# Patient Record
Sex: Female | Born: 1980 | Race: White | Hispanic: No | Marital: Married | Smoking: Never smoker
Health system: Southern US, Community
[De-identification: ages and names within clinical notes are randomized; demographics above are authoritative.]

## PROBLEM LIST (undated history)

## (undated) DIAGNOSIS — R519 Headache, unspecified: Secondary | ICD-10-CM

## (undated) DIAGNOSIS — T7840XA Allergy, unspecified, initial encounter: Secondary | ICD-10-CM

## (undated) DIAGNOSIS — R51 Headache: Secondary | ICD-10-CM

## (undated) HISTORY — DX: Allergy, unspecified, initial encounter: T78.40XA

## (undated) HISTORY — DX: Headache, unspecified: R51.9

## (undated) HISTORY — DX: Headache: R51

## (undated) HISTORY — PX: TONSILLECTOMY: SHX5217

---

## 2010-11-14 NOTE — L&D Delivery Note (Signed)
Delivery Note At 8:44 AM a viable and healthy female was delivered via  (Presentation: ;  ).  APGAR:8 9, ; weight pending .   Placenta status: spontaneous and intact with 3 vessel  Cord:  with the following complications: none .  Cord pH: na  Anesthesia:  epidural Episiotomy: none Lacerations: 1st degree Suture Repair: 3.0 vicryl rapide Est. Blood Loss (mL): 500  Mom to postpartum.  Baby to nursery-stable.  Neima Lacross J 10/16/2011, 8:59 AM

## 2011-04-27 LAB — RPR: RPR: NONREACTIVE

## 2011-04-27 LAB — ANTIBODY SCREEN: Antibody Screen: NEGATIVE

## 2011-04-27 LAB — HEPATITIS B SURFACE ANTIGEN: Hepatitis B Surface Ag: NEGATIVE

## 2011-04-27 LAB — RUBELLA ANTIBODY, IGM: Rubella: IMMUNE

## 2011-06-15 ENCOUNTER — Inpatient Hospital Stay (HOSPITAL_COMMUNITY): Admission: AD | Admit: 2011-06-15 | Payer: Self-pay | Source: Ambulatory Visit | Admitting: Obstetrics and Gynecology

## 2011-10-16 ENCOUNTER — Encounter (HOSPITAL_COMMUNITY): Payer: Self-pay | Admitting: Anesthesiology

## 2011-10-16 ENCOUNTER — Encounter (HOSPITAL_COMMUNITY): Payer: Self-pay | Admitting: *Deleted

## 2011-10-16 ENCOUNTER — Inpatient Hospital Stay (HOSPITAL_COMMUNITY)
Admission: AD | Admit: 2011-10-16 | Discharge: 2011-10-17 | DRG: 775 | Disposition: A | Discharge status: Home / Self Care | Payer: No Typology Code available for payment source | Source: Ambulatory Visit | Attending: Obstetrics and Gynecology | Admitting: Obstetrics and Gynecology

## 2011-10-16 ENCOUNTER — Inpatient Hospital Stay (HOSPITAL_COMMUNITY): Payer: No Typology Code available for payment source | Admitting: Anesthesiology

## 2011-10-16 DIAGNOSIS — O99892 Other specified diseases and conditions complicating childbirth: Secondary | ICD-10-CM | POA: Diagnosis present

## 2011-10-16 DIAGNOSIS — Z22330 Carrier of Group B streptococcus: Secondary | ICD-10-CM

## 2011-10-16 LAB — CBC
Hemoglobin: 11.7 g/dL — ABNORMAL LOW (ref 12.0–15.0)
MCH: 29.9 pg (ref 26.0–34.0)
MCHC: 34.1 g/dL (ref 30.0–36.0)
RDW: 13 % (ref 11.5–15.5)

## 2011-10-16 LAB — RPR: RPR Ser Ql: NONREACTIVE

## 2011-10-16 MED ORDER — OXYTOCIN 10 UNIT/ML IJ SOLN
INTRAMUSCULAR | Status: AC
  Administered 2011-10-16: 20 [IU]
  Filled 2011-10-16: qty 2

## 2011-10-16 MED ORDER — BENZOCAINE-MENTHOL 20-0.5 % EX AERO
1.0000 "application " | INHALATION_SPRAY | CUTANEOUS | Status: DC | PRN
  Administered 2011-10-16: 1 via TOPICAL

## 2011-10-16 MED ORDER — FENTANYL 2.5 MCG/ML BUPIVACAINE 1/10 % EPIDURAL INFUSION (WH - ANES)
14.0000 mL/h | INTRAMUSCULAR | Status: DC
  Filled 2011-10-16: qty 60

## 2011-10-16 MED ORDER — METHYLERGONOVINE MALEATE 0.2 MG/ML IJ SOLN: 0.2000 mg | INTRAMUSCULAR | Status: DC | PRN

## 2011-10-16 MED ORDER — ACETAMINOPHEN 325 MG PO TABS: 650.0000 mg | ORAL_TABLET | ORAL | Status: DC | PRN

## 2011-10-16 MED ORDER — LACTATED RINGERS IV SOLN: 500.0000 mL | Freq: Once | INTRAVENOUS | Status: DC

## 2011-10-16 MED ORDER — DIPHENHYDRAMINE HCL 50 MG/ML IJ SOLN: 12.5000 mg | INTRAMUSCULAR | Status: DC | PRN

## 2011-10-16 MED ORDER — PHENYLEPHRINE 40 MCG/ML (10ML) SYRINGE FOR IV PUSH (FOR BLOOD PRESSURE SUPPORT): 80.0000 ug | PREFILLED_SYRINGE | INTRAVENOUS | Status: DC | PRN

## 2011-10-16 MED ORDER — PHENYLEPHRINE 40 MCG/ML (10ML) SYRINGE FOR IV PUSH (FOR BLOOD PRESSURE SUPPORT)
80.0000 ug | PREFILLED_SYRINGE | INTRAVENOUS | Status: DC | PRN
  Filled 2011-10-16: qty 5

## 2011-10-16 MED ORDER — WITCH HAZEL-GLYCERIN EX PADS: 1.0000 "application " | MEDICATED_PAD | CUTANEOUS | Status: DC | PRN

## 2011-10-16 MED ORDER — BENZOCAINE-MENTHOL 20-0.5 % EX AERO
INHALATION_SPRAY | CUTANEOUS | Status: AC
  Administered 2011-10-16: 1 via TOPICAL
  Filled 2011-10-16: qty 56

## 2011-10-16 MED ORDER — LANOLIN HYDROUS EX OINT: TOPICAL_OINTMENT | CUTANEOUS | Status: DC | PRN

## 2011-10-16 MED ORDER — LACTATED RINGERS IV SOLN: 500.0000 mL | INTRAVENOUS | Status: DC | PRN

## 2011-10-16 MED ORDER — OXYTOCIN 20 UNITS IN LACTATED RINGERS INFUSION - SIMPLE
125.0000 mL/h | Freq: Once | INTRAVENOUS | Status: AC
  Administered 2011-10-16: 999 mL/h via INTRAVENOUS

## 2011-10-16 MED ORDER — PRENATAL PLUS 27-1 MG PO TABS
1.0000 | ORAL_TABLET | Freq: Every day | ORAL | Status: DC
  Administered 2011-10-17: 1 via ORAL
  Filled 2011-10-16: qty 1

## 2011-10-16 MED ORDER — LIDOCAINE HCL (PF) 1 % IJ SOLN
30.0000 mL | INTRAMUSCULAR | Status: DC | PRN
  Filled 2011-10-16: qty 30

## 2011-10-16 MED ORDER — SIMETHICONE 80 MG PO CHEW: 80.0000 mg | CHEWABLE_TABLET | ORAL | Status: DC | PRN

## 2011-10-16 MED ORDER — OXYTOCIN 20 UNITS IN LACTATED RINGERS INFUSION - SIMPLE: 125.0000 mL/h | INTRAVENOUS | Status: DC

## 2011-10-16 MED ORDER — METHYLERGONOVINE MALEATE 0.2 MG PO TABS: 0.2000 mg | ORAL_TABLET | ORAL | Status: DC | PRN

## 2011-10-16 MED ORDER — LIDOCAINE HCL 1.5 % IJ SOLN
INTRAMUSCULAR | Status: DC | PRN
  Administered 2011-10-16: 3 mL via EPIDURAL
  Administered 2011-10-16: 4 mL via EPIDURAL

## 2011-10-16 MED ORDER — DIBUCAINE 1 % RE OINT: 1.0000 "application " | TOPICAL_OINTMENT | RECTAL | Status: DC | PRN

## 2011-10-16 MED ORDER — IBUPROFEN 600 MG PO TABS
600.0000 mg | ORAL_TABLET | Freq: Four times a day (QID) | ORAL | Status: DC
  Administered 2011-10-16 – 2011-10-17 (×4): 600 mg via ORAL
  Filled 2011-10-16 (×4): qty 1

## 2011-10-16 MED ORDER — OXYCODONE-ACETAMINOPHEN 5-325 MG PO TABS: 1.0000 | ORAL_TABLET | ORAL | Status: DC | PRN

## 2011-10-16 MED ORDER — IBUPROFEN 600 MG PO TABS: 600.0000 mg | ORAL_TABLET | Freq: Four times a day (QID) | ORAL | Status: DC | PRN

## 2011-10-16 MED ORDER — DIPHENHYDRAMINE HCL 25 MG PO CAPS: 25.0000 mg | ORAL_CAPSULE | Freq: Four times a day (QID) | ORAL | Status: DC | PRN

## 2011-10-16 MED ORDER — ONDANSETRON HCL 4 MG/2ML IJ SOLN: 4.0000 mg | INTRAMUSCULAR | Status: DC | PRN

## 2011-10-16 MED ORDER — SENNOSIDES-DOCUSATE SODIUM 8.6-50 MG PO TABS
2.0000 | ORAL_TABLET | Freq: Every day | ORAL | Status: DC
  Administered 2011-10-16: 2 via ORAL

## 2011-10-16 MED ORDER — ONDANSETRON HCL 4 MG/2ML IJ SOLN: 4.0000 mg | Freq: Four times a day (QID) | INTRAMUSCULAR | Status: DC | PRN

## 2011-10-16 MED ORDER — EPHEDRINE 5 MG/ML INJ: 10.0000 mg | INTRAVENOUS | Status: DC | PRN

## 2011-10-16 MED ORDER — FLEET ENEMA 7-19 GM/118ML RE ENEM: 1.0000 | ENEMA | RECTAL | Status: DC | PRN

## 2011-10-16 MED ORDER — ONDANSETRON HCL 4 MG PO TABS: 4.0000 mg | ORAL_TABLET | ORAL | Status: DC | PRN

## 2011-10-16 MED ORDER — ZOLPIDEM TARTRATE 5 MG PO TABS: 5.0000 mg | ORAL_TABLET | Freq: Every evening | ORAL | Status: DC | PRN

## 2011-10-16 MED ORDER — OXYCODONE-ACETAMINOPHEN 5-325 MG PO TABS: 2.0000 | ORAL_TABLET | ORAL | Status: DC | PRN

## 2011-10-16 MED ORDER — LACTATED RINGERS IV SOLN
INTRAVENOUS | Status: DC
  Administered 2011-10-16 (×3): via INTRAVENOUS

## 2011-10-16 MED ORDER — EPHEDRINE 5 MG/ML INJ
10.0000 mg | INTRAVENOUS | Status: DC | PRN
  Filled 2011-10-16: qty 4

## 2011-10-16 MED ORDER — TETANUS-DIPHTH-ACELL PERTUSSIS 5-2.5-18.5 LF-MCG/0.5 IM SUSP
0.5000 mL | Freq: Once | INTRAMUSCULAR | Status: AC
  Administered 2011-10-17: 0.5 mL via INTRAMUSCULAR
  Filled 2011-10-16: qty 0.5

## 2011-10-16 MED ORDER — SODIUM CHLORIDE 0.9 % IV SOLN
2.0000 g | Freq: Once | INTRAVENOUS | Status: AC
  Administered 2011-10-16: 2 g via INTRAVENOUS
  Filled 2011-10-16: qty 2000

## 2011-10-16 MED ORDER — CITRIC ACID-SODIUM CITRATE 334-500 MG/5ML PO SOLN: 30.0000 mL | ORAL | Status: DC | PRN

## 2011-10-16 MED ORDER — FENTANYL 2.5 MCG/ML BUPIVACAINE 1/10 % EPIDURAL INFUSION (WH - ANES)
INTRAMUSCULAR | Status: DC | PRN
  Administered 2011-10-16: 12 mL/h via EPIDURAL

## 2011-10-16 MED ORDER — OXYTOCIN BOLUS FROM INFUSION
500.0000 mL | Freq: Once | INTRAVENOUS | Status: DC
  Filled 2011-10-16: qty 500
  Filled 2011-10-16: qty 1000

## 2011-10-16 NOTE — Progress Notes (Signed)
Bianca Butler is a 30 y.o. G2P1001 at [redacted]w[redacted]d by LMP admitted for active labor, rupture of membranes  Subjective: labor  Objective: BP 100/82  Pulse 52  Temp(Src) 98 F (36.7 C) (Oral)  Resp 20  Ht 5' 3.5" (1.613 m)  Wt 57.21 kg (126 lb 2 oz)  BMI 21.99 kg/m2  SpO2 100%      FHT:  FHR: 155 bpm, variability: moderate,  accelerations:  Present,  decelerations:  Absent UC:   regular, every 2 minutes SVE:   Dilation: 10 Effacement (%): 100 Station: +1 Exam by:: k fields, rn  Labs: Lab Results  Component Value Date   WBC 5.8 10/16/2011   HGB 11.7* 10/16/2011   HCT 34.3* 10/16/2011   MCV 87.7 10/16/2011   PLT 161 10/16/2011    Assessment / Plan: Augmentation of labor, progressing well  Labor: Progressing normally Preeclampsia:  na Fetal Wellbeing:  Category I Pain Control:  Epidural I/D:  n/a Anticipated MOD:  NSVD  Cassanda Walmer J 10/16/2011, 8:58 AM

## 2011-10-16 NOTE — Progress Notes (Signed)
Pt states, " My water broke at midnight. It was a lot and it has continued to run out. I am having a few contractions, but not many."

## 2011-10-16 NOTE — Anesthesia Procedure Notes (Signed)
Epidural Patient location during procedure: OB Start time: 10/16/2011 7:39 AM  Staffing Anesthesiologist: Heleena Miceli A. Performed by: anesthesiologist   Preanesthetic Checklist Completed: patient identified, site marked, surgical consent, pre-op evaluation, timeout performed, IV checked, risks and benefits discussed and monitors and equipment checked  Epidural Patient position: sitting Prep: site prepped and draped and DuraPrep Patient monitoring: continuous pulse ox and blood pressure Approach: midline Injection technique: LOR air  Needle:  Needle type: Tuohy  Needle gauge: 17 G Needle length: 9 cm Needle insertion depth: 5 cm cm Catheter type: closed end flexible Catheter size: 19 Gauge Catheter at skin depth: 8 cm Test dose: negative and 1.5% lidocaine  Assessment Events: blood not aspirated, injection not painful, no injection resistance, negative IV test and no paresthesia  Additional Notes Patient is more comfortable after epidural dosed. Please see RN's note for documentation of vital signs and FHR which are stable.

## 2011-10-16 NOTE — H&P (Signed)
NAMEJULI, ODOM              ACCOUNT NO.:  [id number]  MEDICAL RECORD NO.:  [id number]  LOCATION:  9163                          FACILITY:  [facility]  PHYSICIAN:  Lenoard Aden, M.D.DATE OF BIRTH:  06/20/1981  DATE OF ADMISSION:  10/16/2011 DATE OF DISCHARGE:                             HISTORY & PHYSICAL   CHIEF COMPLAINT:  Spontaneous rupture of membranes at midnight.  HISTORY OF PRESENT ILLNESS:  She is a 30 year old Caucasian female, G2, P1, at 38-3/7 weeks, spontaneous rupture of membranes clear, GBS positive who presents n early labor.  She has no known drug allergies. 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  She is a nonsmoker, nondrinker.  She denies domestic or physical violence.  MEDICATIONS:  Include prenatal vitamins.  She has a family history of heart disease, depression, nicotine dependence, diabetes.  She has a previous vaginal delivery at 41 weeks in 2010.  Pregnancy course complicated by group B strep positivity.  PHYSICAL EXAM:  GENERAL:  She is a well-developed, well-nourished, white female, in no acute distress. HEENT:  Normal. NECK:  Supple.  Full range of motion. LUNGS:  Clear. HEART:  Regular rhythm. ABDOMEN:  Soft, gravid, and nontender.  No CVA tenderness. EXTREMITIES:  There are no cords. NEUROLOGIC:  Nonfocal. SKIN:  Intact.  IMPRESSION:  Term intrauterine pregnancy in active labor, spontaneous rupture of membranes, group B streptococcus positive.  PLAN:  Ampicillin IV, epidural p.r.n.  Anticipate attempts at vaginal delivery.     Lenoard Aden, M.D.     RJT/MEDQ  D:  10/16/2011  T:  10/16/2011  Job:  [id number]

## 2011-10-16 NOTE — Anesthesia Preprocedure Evaluation (Addendum)
Anesthesia Evaluation  Patient identified by MRN, date of birth, ID band Patient awake    Reviewed: Allergy & Precautions, H&P , Patient's Chart, lab work & pertinent test results  Airway Mallampati: III TM Distance: >3 FB Neck ROM: full    Dental No notable dental hx. (+) Teeth Intact   Pulmonary neg pulmonary ROS,  clear to auscultation  Pulmonary exam normal       Cardiovascular neg cardio ROS regular Normal    Neuro/Psych Negative Neurological ROS  Negative Psych ROS   GI/Hepatic negative GI ROS, Neg liver ROS,   Endo/Other  Negative Endocrine ROS  Renal/GU negative Renal ROS  Genitourinary negative   Musculoskeletal   Abdominal   Peds  Hematology negative hematology ROS (+)   Anesthesia Other Findings   Reproductive/Obstetrics (+) Pregnancy                           Anesthesia Physical Anesthesia Plan  ASA: II  Anesthesia Plan: Epidural   Post-op Pain Management:    Induction:   Airway Management Planned:   Additional Equipment:   Intra-op Plan:   Post-operative Plan:   Informed Consent: I have reviewed the patients History and Physical, chart, labs and discussed the procedure including the risks, benefits and alternatives for the proposed anesthesia with the patient or authorized representative who has indicated his/her understanding and acceptance.     Plan Discussed with: Anesthesiologist, Surgeon and CRNA  Anesthesia Plan Comments:        Anesthesia Quick Evaluation

## 2011-10-16 NOTE — Anesthesia Postprocedure Evaluation (Signed)
  Anesthesia Post-op Note  Patient: Bianca Butler  Procedure(s) Performed: * No procedures listed *  Patient Location: PACU and Mother/Baby  Anesthesia Type: Epidural  Level of Consciousness: awake, alert  and oriented  Airway and Oxygen Therapy: Patient Spontanous Breathing   Post-op Assessment: Patient's Cardiovascular Status Stable and Respiratory Function Stable  Post-op Vital Signs: stable  Complications: No apparent anesthesia complications

## 2011-10-17 LAB — CBC
HCT: 33.5 % — ABNORMAL LOW (ref 36.0–46.0)
Hemoglobin: 11.3 g/dL — ABNORMAL LOW (ref 12.0–15.0)
MCV: 88.6 fL (ref 78.0–100.0)
RBC: 3.78 MIL/uL — ABNORMAL LOW (ref 3.87–5.11)
WBC: 7.4 10*3/uL (ref 4.0–10.5)

## 2011-10-17 MED ORDER — IBUPROFEN 600 MG PO TABS: 600.0000 mg | ORAL_TABLET | Freq: Four times a day (QID) | ORAL | Status: AC

## 2011-10-17 NOTE — Discharge Summary (Signed)
Obstetric Discharge Summary Reason for Admission: onset of labor Prenatal Procedures: none Intrapartum Procedures: spontaneous vaginal delivery Postpartum Procedures: none Complications-Operative and Postpartum: none Hemoglobin  Date Value Range Status  10/17/2011 11.3* 12.0-15.0 (g/dL) Final     HCT  Date Value Range Status  10/17/2011 33.5* 36.0-46.0 (%) Final    Discharge Diagnoses: Term Pregnancy-delivered  Discharge Information: Date: 10/17/2011 Activity: pelvic rest Diet: routine Medications: PNV and Ibuprofen Condition: stable Instructions: refer to practice specific booklet Discharge to: home Follow-up Information    Follow up with Lenoard Aden, MD. Make an appointment in 6 weeks.   Contact information:   [address] [place] [address] [phone]          Newborn Data: Live born female  Birth Weight: 7 lb 7.9 oz (3400 g) APGAR: 9, 9  Home with mother.  Bianca Butler 10/17/2011, 11:18 AM

## 2011-10-17 NOTE — Progress Notes (Signed)
Patient ID: Bianca Butler, female   DOB: 04-12-1981, 30 y.o.   MRN: [id number] PPD 1 SVD  S:  Reports feeling well, desires early dc home today             Tolerating po/ No nausea or vomiting             Bleeding is spotting             Pain controlled withibuprofen (OTC)             Up ad lib / ambulatory  Newborn breast feeding  / declines Circumcision   O:  A & O x 3, NAD, pleasant affect             VS: Blood pressure 98/66, pulse 56, temperature 97.8 F (36.6 C), temperature source Oral, resp. rate 20, height 5' 3.5" (1.613 m), weight 57.21 kg (126 lb 2 oz), SpO2 100.00%, unknown if currently breastfeeding.  LABS: Lab Results  Component Value Date   WBC 7.4 10/17/2011   HGB 11.3* 10/17/2011   HCT 33.5* 10/17/2011   MCV 88.6 10/17/2011   PLT 148* 10/17/2011     I&O: I/O last 3 completed shifts: In: -  Out: 500 [Blood:500]      Lungs: Clear and unlabored  Heart: regular rate and rhythm / no mumurs  Abdomen: soft, non-tender, non-distended              Fundus: firm, non-tender, U-1  Perineum: healing with no evidence of edema;ecchymosis  Lochia: scant rubra  Breast: Small blister to left nipple with evidence of compression following feed  Extremities: no edema, no calf pain or tenderness    A: PPD # 1   Doing well - stable status  Breastfeeding well- lactation consultation prior to d/c   P:         Routine post partum orders  Discuss positioning and latch techniques to facilitate deeper latch  Anticipate dc home this afternoon  Juanetta Beets [facility] 10/17/2011, 8:36 AM

## 2011-10-17 NOTE — Progress Notes (Signed)
Admission nutrition screen triggered. Patients chart reviewed and assessed  for nutritional risk. Patient is determined to be at low nutrition  risk. Pts weight is up 25 Lbs from pre-preg weight, adeq weight gain during pregnancy

## 2013-05-30 ENCOUNTER — Ambulatory Visit (INDEPENDENT_AMBULATORY_CARE_PROVIDER_SITE_OTHER): Payer: No Typology Code available for payment source | Admitting: Family Medicine

## 2013-05-30 ENCOUNTER — Encounter: Payer: Self-pay | Admitting: Family Medicine

## 2013-05-30 VITALS — BP 110/78 | Temp 98.0°F | Ht 63.5 in | Wt 106.0 lb

## 2013-05-30 DIAGNOSIS — J45909 Unspecified asthma, uncomplicated: Secondary | ICD-10-CM | POA: Insufficient documentation

## 2013-05-30 DIAGNOSIS — J4530 Mild persistent asthma, uncomplicated: Secondary | ICD-10-CM

## 2013-05-30 MED ORDER — PREDNISONE 20 MG PO TABS: ORAL_TABLET | ORAL | Status: DC

## 2013-05-30 NOTE — Patient Instructions (Signed)
Lab work today  Take the prednisone as directed.............. 2 tabs daily for 5 days or until you feel a lot better then taper as outlined  Followup in 3 weeks sooner if any problems

## 2013-05-30 NOTE — Progress Notes (Signed)
  Subjective:    Patient ID: Bianca Butler, female    DOB: [birth year], 32 y.o.   MRN: [id number]  HPI Bianca Butler is a 32 year old married female nonsmoker G2 P2 who comes in today for evaluation of 2 things as a new patient  She and her husband moved into a home in June it was [place] in [place]. It has no crawl  . Once they turn the air conditioner on female began coughing and sneezing and wheezing.  She's had a history of mild allergic rhinitis in the past but no asthma. She's a nonsmoker. They've moved out of the house but she still has chest congestion tightness coughing and wheezing. She is unable to exercise because of shortness of breath.  A second problem is dysplastic nevi  She had to moved in [place]. She does have light skin and light eyes and has a lot of sun exposure in the past.   Review of Systems    review of systems negative for birth control use condoms LMP is now  Father had lymphoma and prostate cancer mother had glaucoma. She gets her eyes checked every year by an ophthalmologist. One sister with allergic rhinitis.  She states she's up on her vaccinations but she does not recall when her last tetanus booster was Objective:   Physical Exam Well-developed well-nourished female no acute distress HEENT were negative neck was supple no adenopathy thyroid normal. Pulmonary exam shows symmetrical breath sounds mild expiratory wheezing bilaterally with forced expiration.  Total body skin exam normal except for 2 scars on her back were she's had dysplastic nevi removed. The rest of her skin all appears normal I do not see anything abnormal       Assessment & Plan:  Reactive airway disease with wheezing probably secondary to mold from the house they moved into.,,,,,,,,,,we'll check labs and started on prednisone to relieve symptoms  They moved out yesterday  History of dysplastic nevi normal skin exam

## 2013-05-31 LAB — MOLD ALLERGENS 1
Cladosporium Herbarum: <0.1 kU/L
Stemphylium Botryosum: <0.1 kU/L

## 2013-05-31 LAB — ALLERGY FULL PROFILE
Allergen,Goose feathers, e70: <0.1 kU/L
Alternaria Alternata: <0.1 kU/L
Bahia Grass: 5.85 kU/L — ABNORMAL HIGH
Bermuda Grass: 1.15 kU/L — ABNORMAL HIGH
Candida Albicans: <0.1 kU/L
Cat Dander: <0.1 kU/L
Curvularia lunata: <0.1 kU/L
Dog Dander: <0.1 kU/L
Fescue: 14 kU/L — ABNORMAL HIGH
Goldenrod: <0.1 kU/L
Helminthosporium halodes: <0.1 kU/L
Lamb's Quarters: 0.14 kU/L — ABNORMAL HIGH
Sycamore Tree: <0.1 kU/L

## 2013-06-05 ENCOUNTER — Ambulatory Visit: Payer: No Typology Code available for payment source | Admitting: Family Medicine

## 2013-06-25 ENCOUNTER — Other Ambulatory Visit: Payer: No Typology Code available for payment source

## 2013-06-25 ENCOUNTER — Encounter: Payer: Self-pay | Admitting: Family

## 2013-06-25 ENCOUNTER — Ambulatory Visit (INDEPENDENT_AMBULATORY_CARE_PROVIDER_SITE_OTHER): Payer: No Typology Code available for payment source | Admitting: Family

## 2013-06-25 ENCOUNTER — Ambulatory Visit: Payer: No Typology Code available for payment source | Admitting: Family Medicine

## 2013-06-25 ENCOUNTER — Ambulatory Visit (INDEPENDENT_AMBULATORY_CARE_PROVIDER_SITE_OTHER)
Admission: RE | Admit: 2013-06-25 | Discharge: 2013-06-25 | Disposition: A | Discharge status: Home / Self Care | Payer: No Typology Code available for payment source | Source: Ambulatory Visit | Attending: Family | Admitting: Family

## 2013-06-25 VITALS — BP 100/70 | HR 68 | Wt 106.0 lb

## 2013-06-25 DIAGNOSIS — J45909 Unspecified asthma, uncomplicated: Secondary | ICD-10-CM

## 2013-06-25 DIAGNOSIS — J4530 Mild persistent asthma, uncomplicated: Secondary | ICD-10-CM

## 2013-06-25 DIAGNOSIS — J309 Allergic rhinitis, unspecified: Secondary | ICD-10-CM

## 2013-06-25 LAB — CBC WITH DIFFERENTIAL/PLATELET
Basophils Absolute: 0 10*3/uL (ref 0.0–0.1)
HCT: 42.2 % (ref 36.0–46.0)
Hemoglobin: 14.1 g/dL (ref 12.0–15.0)
Lymphs Abs: 1.5 10*3/uL (ref 0.7–4.0)
Monocytes Relative: 7.2 % (ref 3.0–12.0)
Neutro Abs: 2.4 10*3/uL (ref 1.4–7.7)
RDW: 12.8 % (ref 11.5–14.6)

## 2013-06-25 LAB — COMPREHENSIVE METABOLIC PANEL
ALT: 16 U/L (ref 0–35)
AST: 19 U/L (ref 0–37)
BUN: 10 mg/dL (ref 6–23)
Creatinine, Ser: 0.8 mg/dL (ref 0.4–1.2)
Total Bilirubin: 0.8 mg/dL (ref 0.3–1.2)

## 2013-06-25 LAB — LIPID PANEL
Cholesterol: 232 mg/dL — ABNORMAL HIGH (ref 0–200)
Total CHOL/HDL Ratio: 3

## 2013-06-25 LAB — POCT URINALYSIS DIPSTICK
Bilirubin, UA: NEGATIVE
Blood, UA: NEGATIVE
Glucose, UA: NEGATIVE
Spec Grav, UA: 1.02

## 2013-06-25 MED ORDER — ALBUTEROL SULFATE HFA 108 (90 BASE) MCG/ACT IN AERS: 2.0000 | INHALATION_SPRAY | Freq: Four times a day (QID) | RESPIRATORY_TRACT | Status: DC | PRN

## 2013-06-25 NOTE — Patient Instructions (Signed)
[address] for chest Xray.  Asthma, Acute Bronchospasm Your exam shows you have asthma, or acute bronchospasm that acts like asthma. Bronchospasm means your air passages become narrowed. These conditions are due to inflammation and airway spasm that cause narrowing of the bronchial tubes in the lungs. This causes you to have wheezing and shortness of breath. POSSIBLE TRIGGERS  Animal dander from the skin, hair, or feathers of animals.  Dust mites contained in house dust.  Cockroaches.  Pollen from trees or grass.  Mold.  Cigarette or tobacco smoke.  Air pollutants such as dust, household cleaners, hair sprays, aerosol sprays, paint fumes, strong chemicals, or strong odors.  Cold air or weather changes. Cold air may cause inflammation. Winds increase molds and pollens in the air.  Strong emotions such as crying or laughing hard.  Stress.  Certain medicines such as aspirin or beta-blockers.  Sulfites in such foods and drinks as dried fruits and wine.  Infections or inflammatory conditions such as a flu, cold, or inflammation of the nasal membranes (rhinitis).  Gastroesophageal reflux disease (GERD). GERD is a condition where stomach acid backs up into your throat (esophagus).  Exercise or strenous activity. TREATMENT  Treatment is aimed at making the narrowed airways larger. Mild asthma or bronchospasm is usually controlled with inhaled medicines. Albuterol is a common medicine that you breathe in to open spastic or narrowed airways. Steroid medicine is also used to reduce the inflammation when an attack is moderate or severe. Antibiotics are only used if a bacterial infection is present.  HOME CARE INSTRUCTIONS   Rest.  Drink plenty of liquids. This helps the mucus to remain thin and easily coughed up. Do not use caffeine or alcohol.  Do not smoke. Avoid being exposed to secondhand smoke.  You play a critical role in keeping yourself in good health. Avoid exposure to  things that cause you to wheeze. Avoid exposure to things that cause you to have breathing problems. Keep your medicines up-to-date and available. Carefully follow your caregiver's treatment plan.  When pollen or pollution is bad, keep windows closed and use an air conditioner or go to places with air conditioning.  Take your medicine exactly as prescribed.  Asthma requires careful medical attention. See your caregiver for follow-up as advised. If you are more than [redacted] weeks pregnant and you were prescribed any new medicines, let your obstetrician know about the visit and how you are doing. Arrange a recheck. SEEK IMMEDIATE MEDICAL CARE IF:   You are getting worse.  You have trouble breathing. If severe, call your local emergency services 911 in [place]  You develop chest pain or discomfort.  You are throwing up or not drinking fluids.  You are coughing up yellow, green, brown, or bloody sputum.  You have a fever or persistent symptoms for more than 2 3 days.  You have a fever and your symptoms suddenly get worse.  You have trouble swallowing. MAKE SURE YOU:   Understand these instructions.  Will watch your condition.  Will get help right away if you are not doing well or get worse. Document Released: 02/15/2007 Document Revised: 10/17/2012 Document Reviewed: 10/15/2007 [facility] Patient Information 2014 [place], [place].

## 2013-06-25 NOTE — Progress Notes (Signed)
Subjective:    Patient ID: Bianca Butler, female    DOB: 03/19/1981, 32 y.o.   MRN: [id number]  HPI 32 year old female, nonsmoker, patient of Dr. Tawanna Cooler is in today for recheck of reactive airway disease. Patient reported to live in a house that had known mold. She moved out of the home while being prepared it. She moved back in and feels her symptoms are better but occasionally has chest tightness. She took the prednisone that was prescribed by Dr. Tawanna Cooler x2 weeks then discontinued it. She reports that she's using window air-conditioners and a humidifier that has helped her condition. She is not currently using a rescue inhaler. She has a least with this home until April.   Review of Systems  Constitutional: Negative.   HENT: Negative.   Respiratory: Negative.  Negative for shortness of breath and wheezing.   Cardiovascular: Negative.   Gastrointestinal: Negative.   Musculoskeletal: Negative.   Skin: Negative.   Allergic/Immunologic: Negative.  Negative for food allergies.  Neurological: Negative.   Hematological: Negative.   Psychiatric/Behavioral: Negative.    Past Medical History  Diagnosis Date  . Allergy   . Headache     History   Social History  . Marital Status: Married    Spouse Name: N/A    Number of Children: N/A  . Years of Education: N/A   Occupational History  . Not on file.   Social History Main Topics  . Smoking status: Never Smoker   . Smokeless tobacco: Never Used  . Alcohol Use: Yes  . Drug Use: No  . Sexually Active: Yes   Other Topics Concern  . Not on file   Social History Narrative  . No narrative on file    Past Surgical History  Procedure Laterality Date  . Tonsillectomy      Family History  Problem Relation Age of Onset  . Diabetes Mother   . Cancer Father     prostate    No Known Allergies  Current Outpatient Prescriptions on File Prior to Visit  Medication Sig Dispense Refill  . prenatal vitamin w/FE, FA (PRENATAL 1 + 1) 27-1  MG TABS Take 1 tablet by mouth daily.        . predniSONE (DELTASONE) 20 MG tablet 2 tabs x5 days, 1 tab x5 days, a half a tab x5 days, then a half a tab Monday Wednesday Friday for a 3 week taper  50 tablet  2   No current facility-administered medications on file prior to visit.    BP 100/70  Pulse 68  Wt 106 lb (48.081 kg)  BMI 18.48 kg/m2  SpO2 98%chart    Objective:   Physical Exam  Constitutional: She is oriented to person, place, and time. She appears well-developed and well-nourished.  HENT:  Right Ear: External ear normal.  Left Ear: External ear normal.  Nose: Nose normal.  Mouth/Throat: Oropharynx is clear and moist.  Neck: Normal range of motion. Neck supple. No thyromegaly present.  Cardiovascular: Normal rate, regular rhythm and normal heart sounds.   Pulmonary/Chest: Effort normal and breath sounds normal. She has no wheezes.  Abdominal: Soft. Bowel sounds are normal.  Musculoskeletal: Normal range of motion.  Neurological: She is alert and oriented to person, place, and time. She has normal reflexes.  Skin: Skin is warm and dry.  Psychiatric: She has a normal mood and affect.          Assessment & Plan:  Assessment: 1. Reactive airway disease 2. Allergic rhinitis  Plan: Pro air 2 puffs every 4-6 hours as needed. Consider an antihistamine like Allegra once daily. Ultimately, I think her issues are environmental and will probably continue to be issues until she moves. Complete physical exam soon as possible. Fasting labs today. Patient requests chest x-ray.

## 2013-07-05 ENCOUNTER — Encounter: Payer: No Typology Code available for payment source | Admitting: Family

## 2013-07-05 ENCOUNTER — Other Ambulatory Visit (HOSPITAL_COMMUNITY)
Admission: RE | Admit: 2013-07-05 | Discharge: 2013-07-05 | Disposition: A | Discharge status: Home / Self Care | Payer: No Typology Code available for payment source | Source: Ambulatory Visit | Attending: Family | Admitting: Family

## 2013-07-05 ENCOUNTER — Encounter: Payer: Self-pay | Admitting: Family

## 2013-07-05 ENCOUNTER — Ambulatory Visit (INDEPENDENT_AMBULATORY_CARE_PROVIDER_SITE_OTHER): Payer: No Typology Code available for payment source | Admitting: Family

## 2013-07-05 VITALS — BP 98/56 | HR 57 | Ht 64.0 in | Wt 106.0 lb

## 2013-07-05 DIAGNOSIS — Z01419 Encounter for gynecological examination (general) (routine) without abnormal findings: Secondary | ICD-10-CM | POA: Insufficient documentation

## 2013-07-05 DIAGNOSIS — Z0000 Encounter for general adult medical examination without abnormal findings: Secondary | ICD-10-CM

## 2013-07-05 DIAGNOSIS — Z124 Encounter for screening for malignant neoplasm of cervix: Secondary | ICD-10-CM

## 2013-07-05 NOTE — Patient Instructions (Signed)

## 2013-07-05 NOTE — Progress Notes (Signed)
Subjective:    Patient ID: Bianca Butler, female    DOB: [birth year], 32 y.o.   MRN: [id number]  HPI 32 year old [place] female, nonsmoker, patient of Dr. Tawanna Cooler in for complete physical exam. She was previously here for reactive airway disease that has improved significantly. Denies any shortness of breath or pain in her chest. Reports she has cleaned out the dust from her house that has helped her to be able to breathe better. Last discussed today and suggestions provided for patient to decrease LDL.   Review of Systems  Constitutional: Negative.   HENT: Negative.   Eyes: Negative.   Respiratory: Negative.   Cardiovascular: Negative.   Gastrointestinal: Negative.   Endocrine: Negative.   Genitourinary: Negative.   Musculoskeletal: Negative.   Skin: Negative.   Allergic/Immunologic: Negative.   Neurological: Negative.   Hematological: Negative.   Psychiatric/Behavioral: Negative.    Past Medical History  Diagnosis Date  . Allergy   . Headache     History   Social History  . Marital Status: Married    Spouse Name: N/A    Number of Children: N/A  . Years of Education: N/A   Occupational History  . Not on file.   Social History Main Topics  . Smoking status: Never Smoker   . Smokeless tobacco: Never Used  . Alcohol Use: Yes  . Drug Use: No  . Sexual Activity: Yes   Other Topics Concern  . Not on file   Social History Narrative  . No narrative on file    Past Surgical History  Procedure Laterality Date  . Tonsillectomy      Family History  Problem Relation Age of Onset  . Diabetes Mother   . Cancer Father     prostate    No Known Allergies  Current Outpatient Prescriptions on File Prior to Visit  Medication Sig Dispense Refill  . albuterol (PROVENTIL HFA;VENTOLIN HFA) 108 (90 BASE) MCG/ACT inhaler Inhale 2 puffs into the lungs every 6 (six) hours as needed for wheezing.  1 Inhaler  2  . prenatal vitamin w/FE, FA (PRENATAL 1 + 1) 27-1 MG TABS Take 1  tablet by mouth daily.        . predniSONE (DELTASONE) 20 MG tablet 2 tabs x5 days, 1 tab x5 days, a half a tab x5 days, then a half a tab Monday Wednesday Friday for a 3 week taper  50 tablet  2   No current facility-administered medications on file prior to visit.    BP 98/56  Pulse 57  Ht 5\' 4"  (1.626 m)  Wt 106 lb (48.081 kg)  BMI 18.19 kg/m2  SpO2 96%  LMP 07/29/2014chart    Objective:   Physical Exam  Constitutional: She is oriented to person, place, and time. She appears well-developed and well-nourished.  HENT:  Head: Normocephalic.  Right Ear: External ear normal.  Left Ear: External ear normal.  Nose: Nose normal.  Mouth/Throat: Oropharynx is clear and moist.  Eyes: Conjunctivae and EOM are normal. Pupils are equal, round, and reactive to light. Right eye exhibits no discharge.  Neck: Normal range of motion. Neck supple. No thyromegaly present.  Cardiovascular: Normal rate, regular rhythm and normal heart sounds.   Pulmonary/Chest: Effort normal and breath sounds normal.  Abdominal: Soft. Bowel sounds are normal. She exhibits no distension. There is no tenderness. There is no rebound and no guarding.  Genitourinary: Vagina normal and uterus normal. No vaginal discharge found.  Musculoskeletal: Normal range of motion. She exhibits no  edema and no tenderness.  Neurological: She is alert and oriented to person, place, and time. She has normal reflexes.  Skin: Skin is warm and dry.  Psychiatric: She has a normal mood and affect.          Assessment & Plan:  Assessment: 1. Complete physical exam 2. Pap smear 3. Hyperlipidemia  Plan: Encouraged healthy diet, exercise, low cholesterol diet. Recheck LDL in 6 months. Patient will inquire with her family to see if there is a family history of hyperlipidemia. On the office with any questions or concerns. Recheck 6 months and sooner as needed. Pap smear sent.

## 2013-07-09 ENCOUNTER — Encounter: Payer: Self-pay | Admitting: Family

## 2013-09-19 ENCOUNTER — Other Ambulatory Visit: Payer: Self-pay

## 2014-02-24 ENCOUNTER — Ambulatory Visit: Payer: No Typology Code available for payment source | Admitting: Family

## 2014-02-24 ENCOUNTER — Encounter: Payer: Self-pay | Admitting: Family Medicine

## 2014-02-24 ENCOUNTER — Ambulatory Visit (INDEPENDENT_AMBULATORY_CARE_PROVIDER_SITE_OTHER): Payer: No Typology Code available for payment source | Admitting: Family Medicine

## 2014-02-24 VITALS — BP 100/64

## 2014-02-24 DIAGNOSIS — J45909 Unspecified asthma, uncomplicated: Secondary | ICD-10-CM

## 2014-02-24 NOTE — Progress Notes (Signed)
   Subjective:    Patient ID: Bianca Butler, female    DOB: 05/15/1981, 33 y.o.   MRN: [id number]  HPI  Bianca Banekatrin is a 33 year old female who comes in today accompanied by her husband to discuss reactive airway disease  Review of Systems    negative see previous note Objective:   Physical Exam  No exam done today      Assessment & Plan:  History of reactive airway disease pulmonary consult as outlined

## 2014-02-24 NOTE — Progress Notes (Signed)
Pre visit review using our clinic review tool, if applicable. No additional management support is needed unless otherwise documented below in the visit note. 

## 2014-02-24 NOTE — Patient Instructions (Signed)
We will consult with pulmonary

## 2014-02-25 ENCOUNTER — Encounter: Payer: Self-pay | Admitting: Family

## 2014-03-03 ENCOUNTER — Encounter: Payer: Self-pay | Admitting: Family Medicine

## 2014-03-03 ENCOUNTER — Telehealth: Payer: Self-pay | Admitting: Family Medicine

## 2014-03-03 DIAGNOSIS — J45909 Unspecified asthma, uncomplicated: Secondary | ICD-10-CM

## 2014-03-03 NOTE — Telephone Encounter (Signed)
Referral placed.

## 2014-03-03 NOTE — Telephone Encounter (Signed)
Pt states she needs a referral for her asthma.

## 2014-03-10 ENCOUNTER — Institutional Professional Consult (permissible substitution): Payer: No Typology Code available for payment source | Admitting: Internal Medicine

## 2014-03-17 ENCOUNTER — Institutional Professional Consult (permissible substitution): Payer: No Typology Code available for payment source | Admitting: Internal Medicine

## 2014-04-28 ENCOUNTER — Ambulatory Visit (INDEPENDENT_AMBULATORY_CARE_PROVIDER_SITE_OTHER): Payer: No Typology Code available for payment source | Admitting: Family Medicine

## 2014-04-28 ENCOUNTER — Encounter: Payer: Self-pay | Admitting: Family Medicine

## 2014-04-28 VITALS — BP 102/72 | Temp 98.6°F | Wt 106.0 lb

## 2014-04-28 DIAGNOSIS — J45909 Unspecified asthma, uncomplicated: Secondary | ICD-10-CM

## 2014-04-28 DIAGNOSIS — J029 Acute pharyngitis, unspecified: Secondary | ICD-10-CM

## 2014-04-28 LAB — POCT RAPID STREP A (OFFICE): Rapid Strep A Screen: NEGATIVE

## 2014-04-28 MED ORDER — MONTELUKAST SODIUM 10 MG PO TABS: 10.0000 mg | ORAL_TABLET | Freq: Every day | ORAL | Status: DC

## 2014-04-28 MED ORDER — ALBUTEROL SULFATE HFA 108 (90 BASE) MCG/ACT IN AERS: 2.0000 | INHALATION_SPRAY | Freq: Four times a day (QID) | RESPIRATORY_TRACT | Status: DC | PRN

## 2014-04-28 MED ORDER — ALBUTEROL SULFATE HFA 108 (90 BASE) MCG/ACT IN AERS
2.0000 | INHALATION_SPRAY | Freq: Four times a day (QID) | RESPIRATORY_TRACT | Status: AC | PRN
Start: 2014-04-28 — End: ?

## 2014-04-28 NOTE — Patient Instructions (Signed)
Singulair 10 mg........... one daily at bedtime all year round  Albuterol.................. 2 puffs 3 times daily when necessary  Plain Zyrtec 10 mg............Marland Kitchen. 1 at bedtime as needed for allergic rhinitis  Return when necessary

## 2014-04-28 NOTE — Progress Notes (Addendum)
   Subjective:    Patient ID: Fenton MallingKatrin Lofland, female    DOB: 11/16/1980, 33 y.o.   MRN: [id number]  HPI Katrina is a 33 year old married female G2 P2 nonsmoker who comes in today for evaluation of wheezing and to discuss strep throat  She states her daughter developed a sore throat and a fever this past Friday. They went to the doctor on Saturday initial rapid strep was negative followup culture was positive. 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 She hasn't taken an antihistamine for her allergic rhinitis.  She herself has a history of asthma, which started with exposure to the mold in their rental house in 2014.  At elevated indoor and outdoor temperatures she starts coughing and wheezing.  Currently work is performed on the [facility]y [facility]   Review of Systems Review of systems otherwise negative no respiratory distress    Objective:   Physical Exam  Well-developed well nourished female no acute distress vital signs stable she is afebrile HEENT negative neck was supple no adenopathy lungs are clear except for some very mild late expiratory wheezing on forced expiration      Assessment & Plan:  Allergic rhinitis and asthma ,,,,,,,,,,,,,,, begin Singulair, albuterol when necessary, add steroid inhaler. If the above does not work,

## 2014-09-06 IMAGING — CR DG CHEST 2V
2 series · 2 of 2 positions shown · non-contrast
Comparison: None

CLINICAL DATA: Mid chest pain.  Follow-up reactive airway disease.

CHEST - 2 VIEW

[view not recorded (1 of 2)]
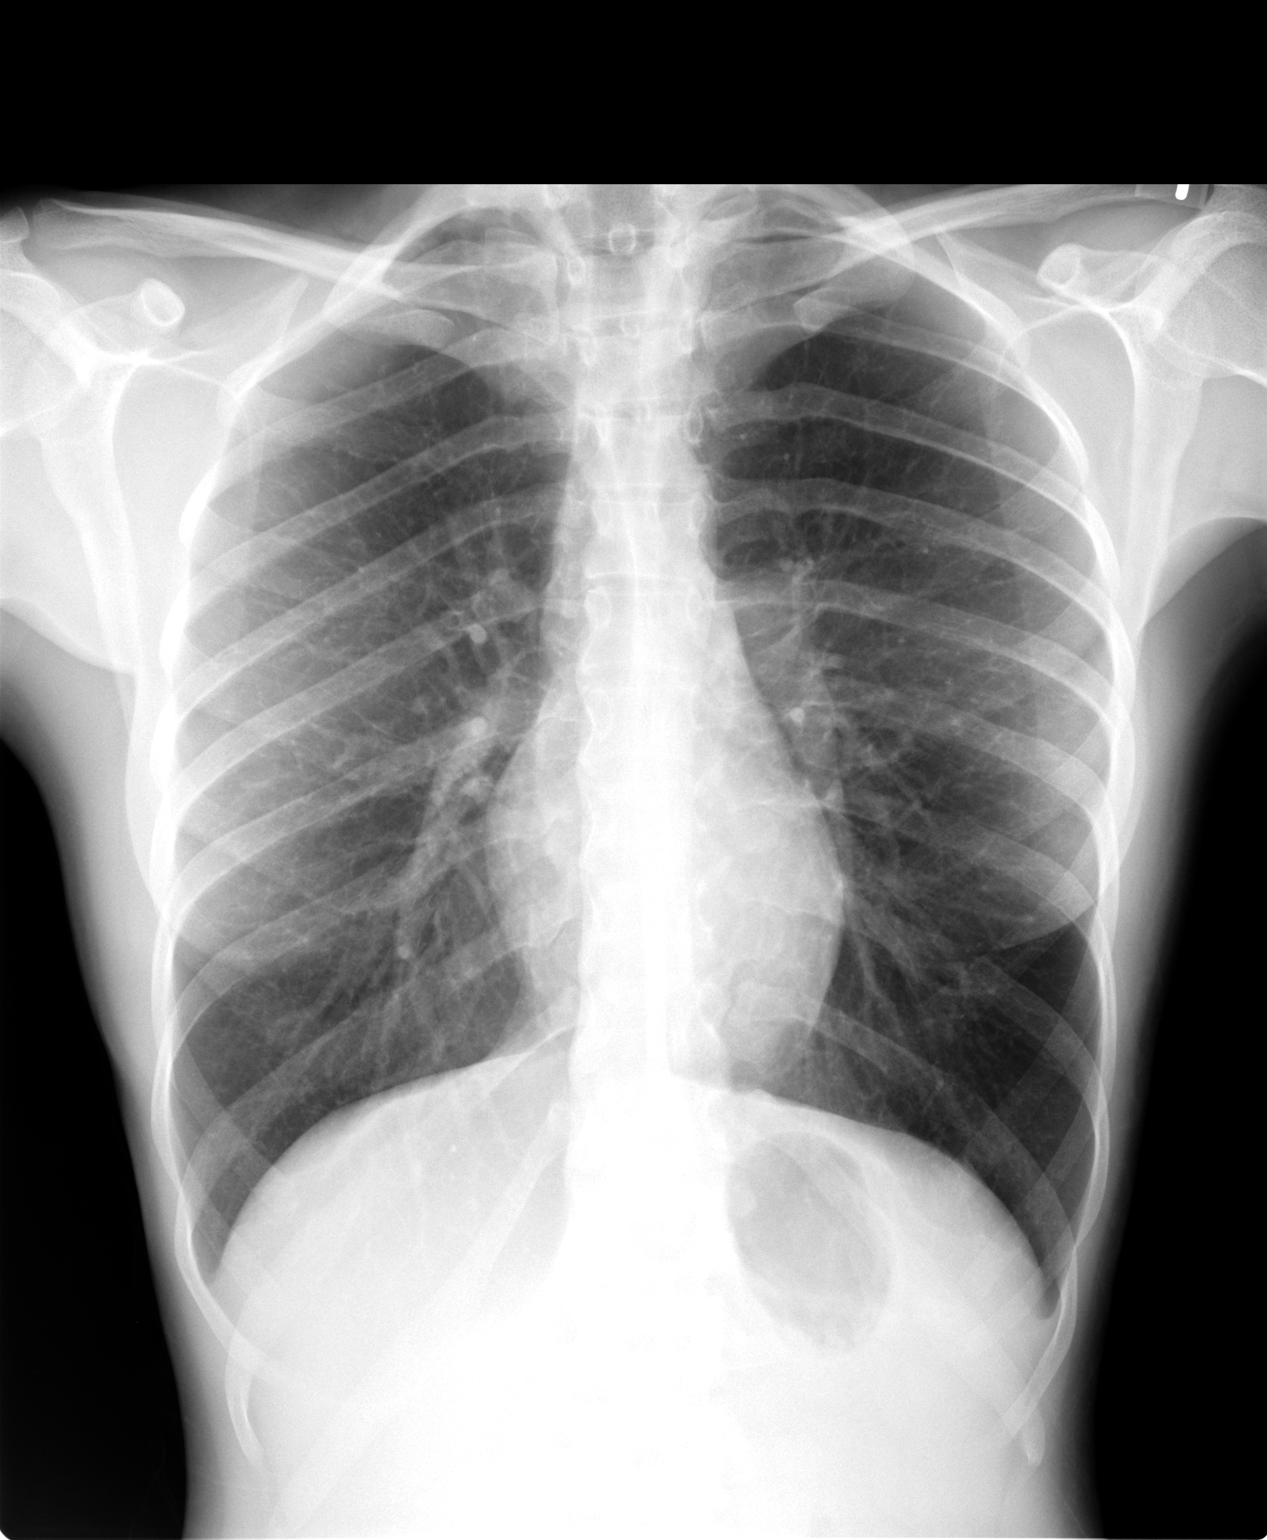

[view not recorded (2 of 2)]
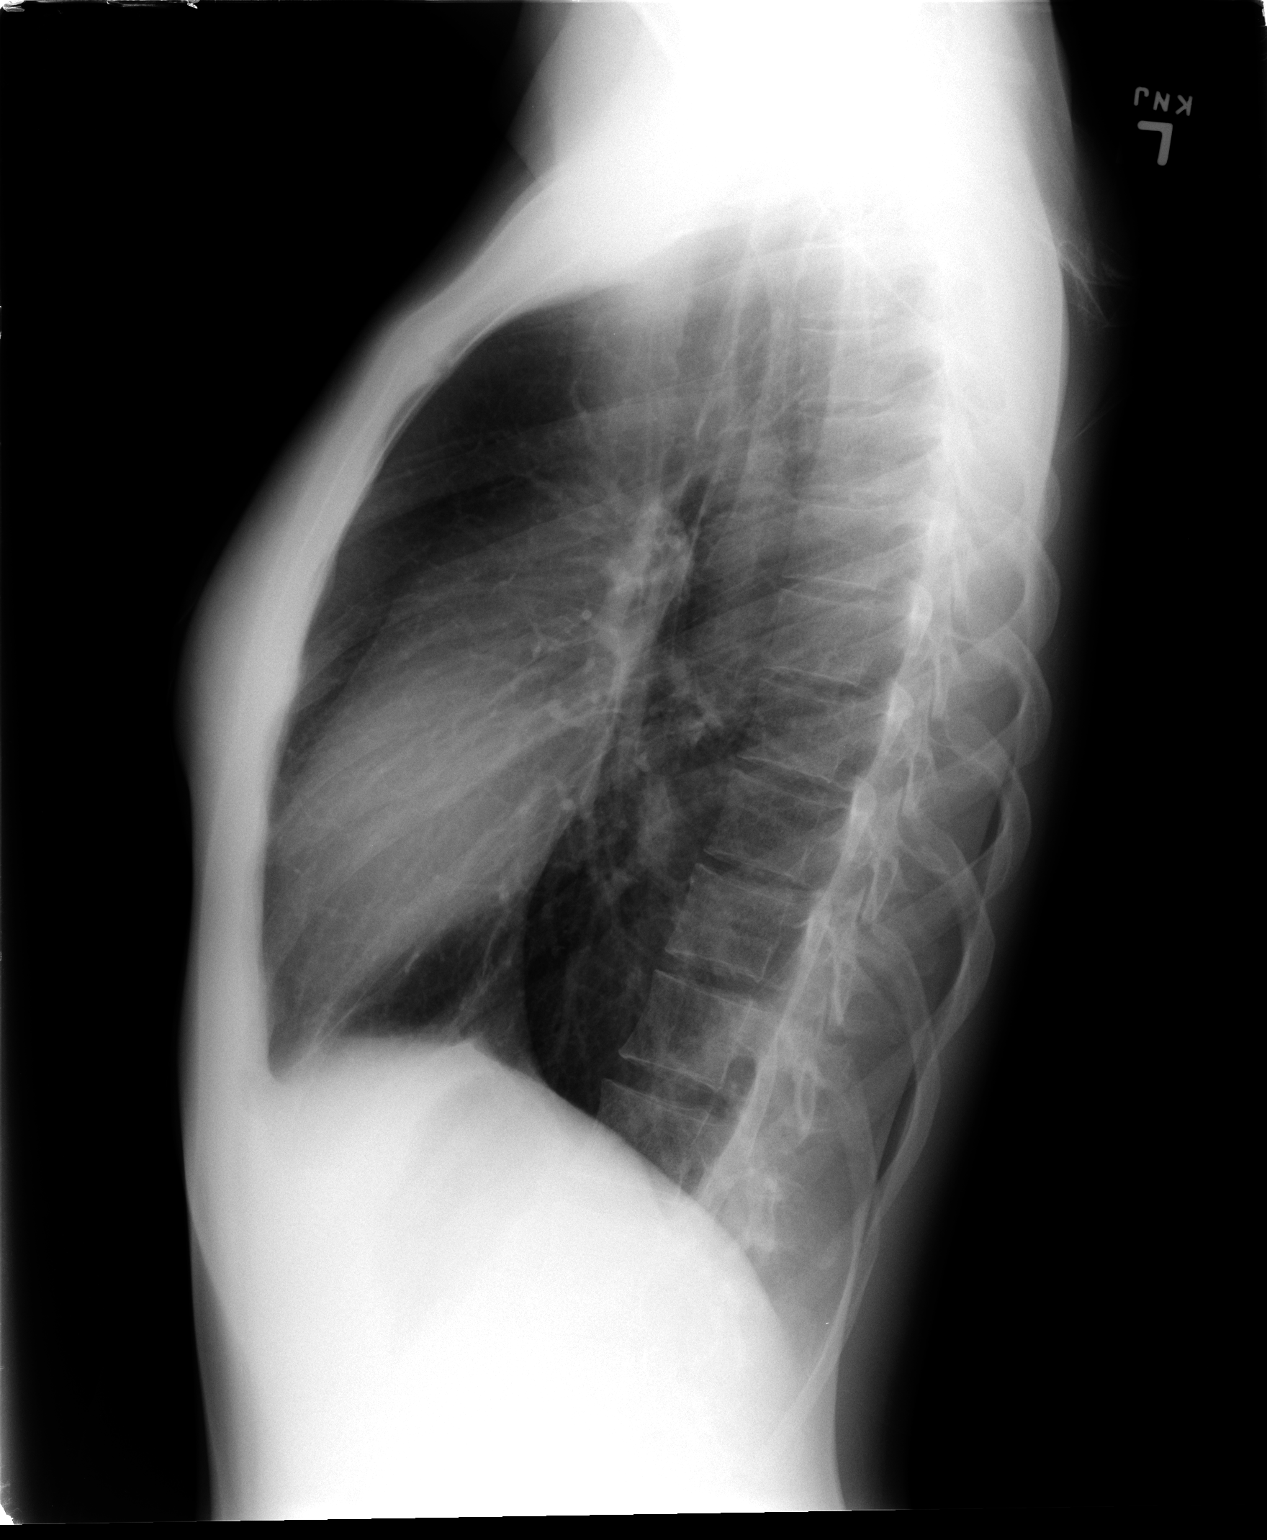

[2 of 2 positions shown; findings below may reference images not displayed]

FINDINGS: Midline trachea.  Normal heart size and mediastinal
contours. No pleural effusion or pneumothorax.  Clear lungs.
IMPRESSION: No acute cardiopulmonary disease.

## 2014-09-15 ENCOUNTER — Encounter: Payer: Self-pay | Admitting: Family Medicine

## 2015-07-10 ENCOUNTER — Ambulatory Visit (INDEPENDENT_AMBULATORY_CARE_PROVIDER_SITE_OTHER): Payer: No Typology Code available for payment source | Admitting: Family Medicine

## 2015-07-10 ENCOUNTER — Encounter: Payer: Self-pay | Admitting: Family Medicine

## 2015-07-10 VITALS — BP 100/70 | HR 76 | Temp 98.2°F | Ht 64.0 in | Wt 105.6 lb

## 2015-07-10 DIAGNOSIS — H109 Unspecified conjunctivitis: Secondary | ICD-10-CM | POA: Diagnosis not present

## 2015-07-10 MED ORDER — ERYTHROMYCIN 5 MG/GM OP OINT: 1.0000 "application " | TOPICAL_OINTMENT | Freq: Every day | OPHTHALMIC | Status: AC

## 2015-07-10 NOTE — Patient Instructions (Signed)

## 2015-07-10 NOTE — Progress Notes (Signed)
  HPI:  ? Pink eye: -started yesterday -R eye itchy, a little drainage - yellow, irritation, a little red -reports she wants to treat to avoid giving to her children -denies: eye pain, vision changes, HA, nausea  ROS: See pertinent positives and negatives per HPI.  Past Medical History  Diagnosis Date  . Allergy   . Headache     Past Surgical History  Procedure Laterality Date  . Tonsillectomy      Family History  Problem Relation Age of Onset  . Diabetes Mother   . Cancer Father     prostate    Social History   Social History  . Marital Status: Married    Spouse Name: N/A  . Number of Children: N/A  . Years of Education: N/A   Social History Main Topics  . Smoking status: Never Smoker   . Smokeless tobacco: Never Used  . Alcohol Use: Yes  . Drug Use: No  . Sexual Activity: Yes   Other Topics Concern  . None   Social History Narrative     Current outpatient prescriptions:  .  albuterol (PROVENTIL HFA;VENTOLIN HFA) 108 (90 BASE) MCG/ACT inhaler, Inhale 2 puffs into the lungs every 6 (six) hours as needed for wheezing., Disp: 1 Inhaler, Rfl: 2 .  montelukast (SINGULAIR) 10 MG tablet, Take 1 tablet (10 mg total) by mouth at bedtime., Disp: 100 tablet, Rfl: 3 .  erythromycin (ROMYCIN) ophthalmic ointment, Place 1 application into the right eye at bedtime. Nightly for 5-7 days, Disp: 3.5 g, Rfl: 0  EXAM:  Filed Vitals:   07/10/15 0905  BP: 100/70  Pulse: 76  Temp: 98.2 F (36.8 C)    Body mass index is 18.12 kg/(m^2).  GENERAL: vitals reviewed and listed above, alert, oriented, appears well hydrated and in no acute distress  HEENT: atraumatic, minimal conjunttiva erythema R eye, minimal clear discharge in R eye, no obvious abnormalities on inspection of external nose and ears  NECK: no obvious masses on inspection  MS: moves all extremities without noticeable abnormality  PSYCH: pleasant and cooperative, no obvious depression or  anxiety  ASSESSMENT AND PLAN:  Discussed the following assessment and plan:  Conjunctivitis of right eye  -likely viral, plan for warm compresses -abx ointment if worsening or not improving as expected -good handwashing -return and optho precautions -Patient advised to return or notify a doctor immediately if symptoms worsen or persist or new concerns arise.  There are no Patient Instructions on file for this visit.   Kriste Basque R.

## 2015-07-10 NOTE — Progress Notes (Signed)
Pre visit review using our clinic review tool, if applicable. No additional management support is needed unless otherwise documented below in the visit note. 

## 2016-03-21 ENCOUNTER — Telehealth: Payer: Self-pay | Admitting: Family Medicine

## 2016-03-21 DIAGNOSIS — J45909 Unspecified asthma, uncomplicated: Secondary | ICD-10-CM

## 2016-03-21 DIAGNOSIS — J029 Acute pharyngitis, unspecified: Secondary | ICD-10-CM

## 2016-03-21 MED ORDER — MONTELUKAST SODIUM 10 MG PO TABS: 10.0000 mg | ORAL_TABLET | Freq: Every day | ORAL | Status: AC

## 2016-03-21 NOTE — Telephone Encounter (Signed)
Refill sent.

## 2016-03-21 NOTE — Telephone Encounter (Signed)
Pt request refill  montelukast (SINGULAIR) 10 MG tablet  [place]  Pt would like a 90 day to take with her  if possible.  Please let me know if pt needs appointment and she will schedule.  [employer]/ friendly

## 2017-08-04 ENCOUNTER — Encounter: Payer: Self-pay | Admitting: Family Medicine
